# Patient Record
Sex: Female | Born: 2000 | Race: Black or African American | Hispanic: No | Marital: Single
Health system: Southern US, Community
[De-identification: ages and names within clinical notes are randomized; demographics above are authoritative.]

---

## 2019-05-01 ENCOUNTER — Other Ambulatory Visit: Payer: Self-pay

## 2019-05-01 DIAGNOSIS — Z20822 Contact with and (suspected) exposure to covid-19: Secondary | ICD-10-CM

## 2019-05-02 LAB — NOVEL CORONAVIRUS, NAA: SARS-CoV-2, NAA: NOT DETECTED

## 2020-10-05 ENCOUNTER — Emergency Department (HOSPITAL_COMMUNITY): Payer: Medicaid - Out of State

## 2020-10-05 ENCOUNTER — Emergency Department (HOSPITAL_COMMUNITY)
Admission: EM | Admit: 2020-10-05 | Discharge: 2020-10-05 | Disposition: A | Discharge status: Home / Self Care | Payer: Medicaid - Out of State | Attending: Emergency Medicine | Admitting: Emergency Medicine

## 2020-10-05 ENCOUNTER — Encounter (HOSPITAL_COMMUNITY): Payer: Self-pay | Admitting: Student in an Organized Health Care Education/Training Program

## 2020-10-05 ENCOUNTER — Other Ambulatory Visit: Payer: Self-pay

## 2020-10-05 DIAGNOSIS — S4991XA Unspecified injury of right shoulder and upper arm, initial encounter: Secondary | ICD-10-CM | POA: Diagnosis present

## 2020-10-05 DIAGNOSIS — Y9302 Activity, running: Secondary | ICD-10-CM | POA: Diagnosis not present

## 2020-10-05 DIAGNOSIS — W01198A Fall on same level from slipping, tripping and stumbling with subsequent striking against other object, initial encounter: Secondary | ICD-10-CM | POA: Insufficient documentation

## 2020-10-05 DIAGNOSIS — S43004A Unspecified dislocation of right shoulder joint, initial encounter: Secondary | ICD-10-CM | POA: Diagnosis not present

## 2020-10-05 MED ORDER — LIDOCAINE HCL (PF) 1 % IJ SOLN
10.0000 mL | Freq: Once | INTRAMUSCULAR | Status: AC
  Administered 2020-10-05: 10 mL
  Filled 2020-10-05: qty 10

## 2020-10-05 MED ORDER — DIAZEPAM 5 MG/ML IJ SOLN
5.0000 mg | Freq: Once | INTRAMUSCULAR | Status: AC
  Administered 2020-10-05: 5 mg via INTRAVENOUS
  Filled 2020-10-05: qty 2

## 2020-10-05 MED ORDER — FENTANYL CITRATE (PF) 100 MCG/2ML IJ SOLN
50.0000 ug | Freq: Once | INTRAMUSCULAR | Status: AC
  Administered 2020-10-05: 50 ug via INTRAVENOUS
  Filled 2020-10-05: qty 2

## 2020-10-05 NOTE — ED Provider Notes (Signed)
[facility] EMERGENCY DEPARTMENT Provider Note   CSN: [id number] Arrival date & time: 10/05/20  1828     History Chief Complaint  Patient presents with  . Shoulder Injury    Meagan Blair is a 20 y.o. female.   Shoulder Injury This is a new problem. The current episode started less than 1 hour ago. The problem occurs constantly. The problem has not changed since onset.Pertinent negatives include no chest pain, no abdominal pain and no shortness of breath. Nothing aggravates the symptoms. Nothing relieves the symptoms.       History reviewed. No pertinent past medical history.  There are no problems to display for this patient.   History reviewed. No pertinent surgical history.   OB History   No obstetric history on file.     History reviewed. No pertinent family history.     Home Medications Prior to Admission medications   Not on File    Allergies    Patient has no known allergies.  Review of Systems   Review of Systems  Constitutional: Negative for chills and fever.  HENT: Negative for ear pain and sore throat.   Eyes: Negative for pain and visual disturbance.  Respiratory: Negative for cough and shortness of breath.   Cardiovascular: Negative for chest pain and palpitations.  Gastrointestinal: Negative for abdominal pain and vomiting.  Genitourinary: Negative for dysuria and hematuria.  Musculoskeletal: Negative for arthralgias and back pain.       R shoulder pain  Skin: Negative for color change and rash.  Neurological: Negative for seizures and syncope.  All other systems reviewed and are negative.   Physical Exam Updated Vital Signs BP 125/77   Pulse 94   Temp 98.4 F (36.9 C) (Oral)   Resp (!) 24   Ht 5' 2.5" (1.588 m)   Wt 53.5 kg   SpO2 100%   BMI 21.24 kg/m   Physical Exam Vitals and nursing note reviewed.  Constitutional:      General: She is not in acute distress.    Appearance: She is well-developed and  well-nourished.  HENT:     Head: Normocephalic and atraumatic.  Eyes:     Conjunctiva/sclera: Conjunctivae normal.  Cardiovascular:     Rate and Rhythm: Normal rate and regular rhythm.     Heart sounds: No murmur heard.   Pulmonary:     Effort: Pulmonary effort is normal. No respiratory distress.     Breath sounds: Normal breath sounds.  Abdominal:     Palpations: Abdomen is soft.     Tenderness: There is no abdominal tenderness.  Musculoskeletal:        General: No edema.     Cervical back: Neck supple.  Skin:    General: Skin is warm and dry.  Neurological:     Mental Status: She is alert.  Psychiatric:        Mood and Affect: Mood and affect normal.     ED Results / Procedures / Treatments   Labs (all labs ordered are listed, but only abnormal results are displayed) Labs Reviewed - No data to display  EKG None  Radiology DG Shoulder Right  Result Date: 10/05/2020 CLINICAL DATA:  Trip and fall injury.  Shoulder pain EXAM: RIGHT SHOULDER - 2+ VIEW COMPARISON:  None. FINDINGS: Anterior dislocation of the right shoulder with anteromedial displacement of the humeral head respect. Suggestion of a small focal defect lateral consistent with Hill-Sachs deformity. Acromioclavicular and coracoclavicular spaces are maintained. Soft tissues  are unremarkable. IMPRESSION: Anterior dislocation of the right shoulder with probable Hill-Sachs deformity. Electronically Signed   By: Burman Nieves M.D.   On: 10/05/2020 19:05   DG Shoulder Right Portable  Result Date: 10/05/2020 CLINICAL DATA:  Reduction of shoulder dislocation EXAM: PORTABLE RIGHT SHOULDER COMPARISON:  None. FINDINGS: Improved alignment of the right glenohumeral joint following reduction. IMPRESSION: Successful reduction of right glenohumeral joint dislocation. Electronically Signed   By: Deatra Mossberg M.D.   On: 10/05/2020 21:31    Procedures .Ortho Injury Treatment  Date/Time: 10/05/2020 9:12 PM Performed by:  Kathleen Lime, MD Authorized by: Charlynne Pander, MD  Injury location: shoulder Injury type: dislocation Dislocation type: anterior Hill-Sachs deformity: yes Chronicity: new Pre-procedure neurovascular assessment: neurovascularly intact Pre-procedure distal perfusion: normal Pre-procedure neurological function: normal Pre-procedure range of motion: normal  Anesthesia: Local anesthesia used: yes Local Anesthetic: lidocaine 1% without epinephrine Anesthetic total: 10 mL  Patient sedated: NoManipulation performed: yes Reduction method: traction and counter traction Reduction successful: yes X-ray confirmed reduction: yes Immobilization: sling Post-procedure neurovascular assessment: post-procedure neurovascularly intact Post-procedure distal perfusion: normal Post-procedure neurological function: normal Post-procedure range of motion: normal      Medications Ordered in ED Medications  lidocaine (PF) (XYLOCAINE) 1 % injection 10 mL (10 mLs Other Given by Other 10/05/20 2059)  diazepam (VALIUM) injection 5 mg (5 mg Intravenous Given 10/05/20 2111)  fentaNYL (SUBLIMAZE) injection 50 mcg (50 mcg Intravenous Given 10/05/20 2111)    ED Course  I have reviewed the triage vital signs and the nursing notes.  Pertinent labs & imaging results that were available during my care of the patient were reviewed by me and considered in my medical decision making (see chart for details).    MDM Rules/Calculators/A&P                          This is a 20 year old female with no significant past medical history presents emergency department for evaluation of right shoulder injury. Patient reports that she was running for a phone, tripped over the rug and fell on her right outstretched arm when she sustained the injury. She has no history of previous shoulder injuries. She denies any numbness and tingling. Patient reports she is on birth control and denies any pregnancy.  On exam she is  afebrile, hemodynamically stable, no acute distress. The right shoulder shows an obvious deformity as she holds it slightly abducted and internally rotated. She has sensation over the axillary dermatome, and motor is intact with radial, ulnar, median nerve testing. 2+ radial pulse. Sensation is intact in the remaining C6-T1 dermatomes.  Right shoulder x-ray reveals anterior medial dislocation with likely Hill-Sachs deformity which goes along with the reported dislocation.  Right shoulder was reduced in the emergency department, neurovascularly intact following reduction.  Patient will be discharged with sling, the number for orthopedic surgery to follow-up with, and instructed use Tylenol ibuprofen for pain.   Final Clinical Impression(s) / ED Diagnoses Final diagnoses:  Dislocation of right shoulder joint, initial encounter    Rx / DC Orders ED Discharge Orders    None       Kathleen Lime, MD 10/05/20 2224    Charlynne Pander, MD 10/05/20 2237

## 2020-10-05 NOTE — ED Notes (Signed)
Reviewed discharge instructions with patient and significant other. Follow-up care and pain management reviewed. Patient and significant other verbalized understanding. Patient A&Ox4, VSS, and ambulatory with steady gait upon discharge.  

## 2020-10-05 NOTE — Discharge Instructions (Signed)
Please use the sling for comfort, you can use Tylenol and ibuprofen to help with pain.  Please return to the emergency department if you develop numbness in that arm, weakness with your wrist or grip strength.  I have provided the number for Dr. Duwayne Heck who is an orthopedic surgeon, you may call their office to schedule an appointment and let them know that you were seen in the emergency department and had a dislocated shoulder that was put back into place.

## 2020-10-05 NOTE — ED Triage Notes (Signed)
Pt from home for R shoulder deformity after falling and trying to catch herself. Arrive with shoulder in sling from EMS.

## 2022-07-17 IMAGING — DX DG SHOULDER 2+V PORT*R*
1 series · 2 of 2 positions shown · non-contrast
Comparison: None.

CLINICAL DATA: Reduction of shoulder dislocation

EXAM:
PORTABLE RIGHT SHOULDER

[Series 1: shoulder · 0.14mm/px · 2 of 2 slices shown]
[im 1/2]
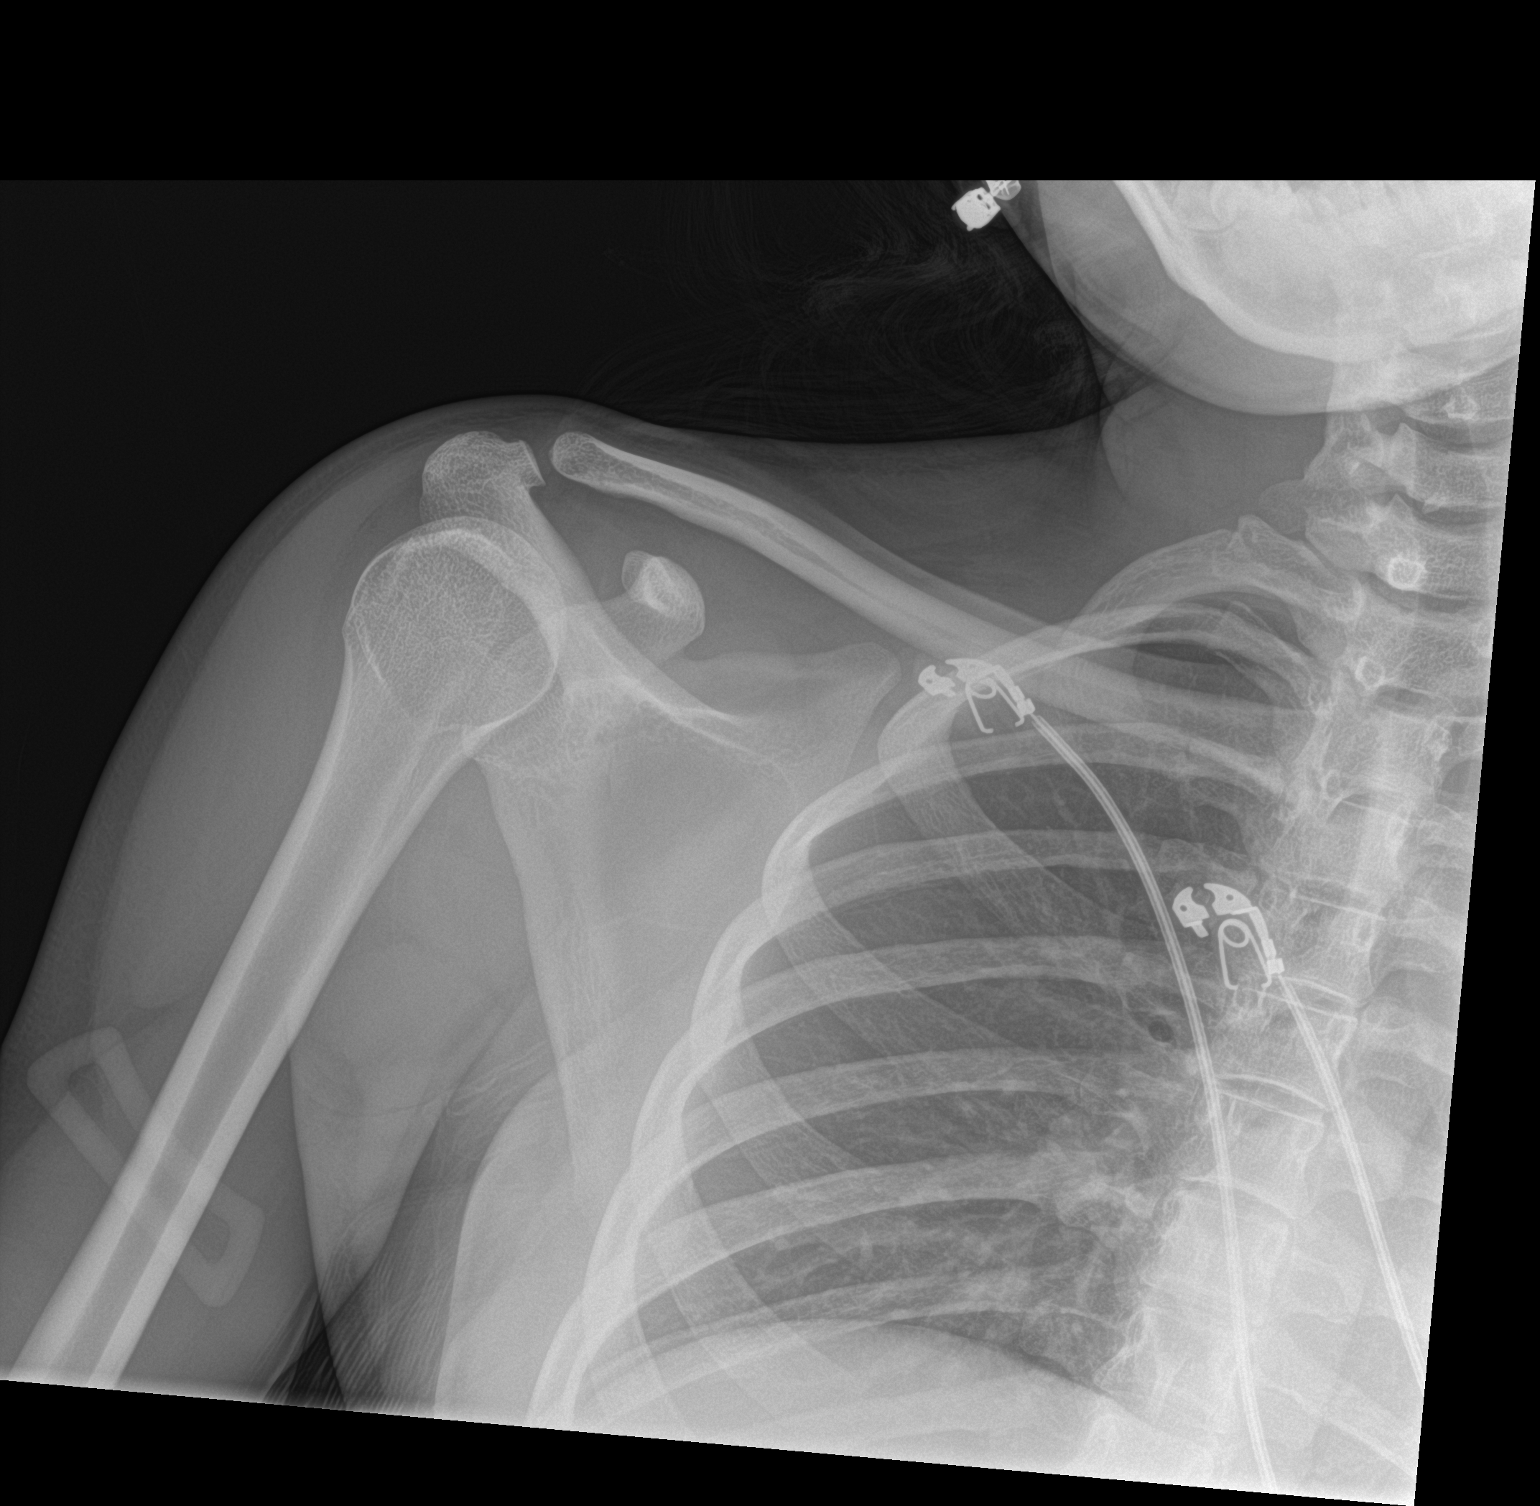
[im 2/2]
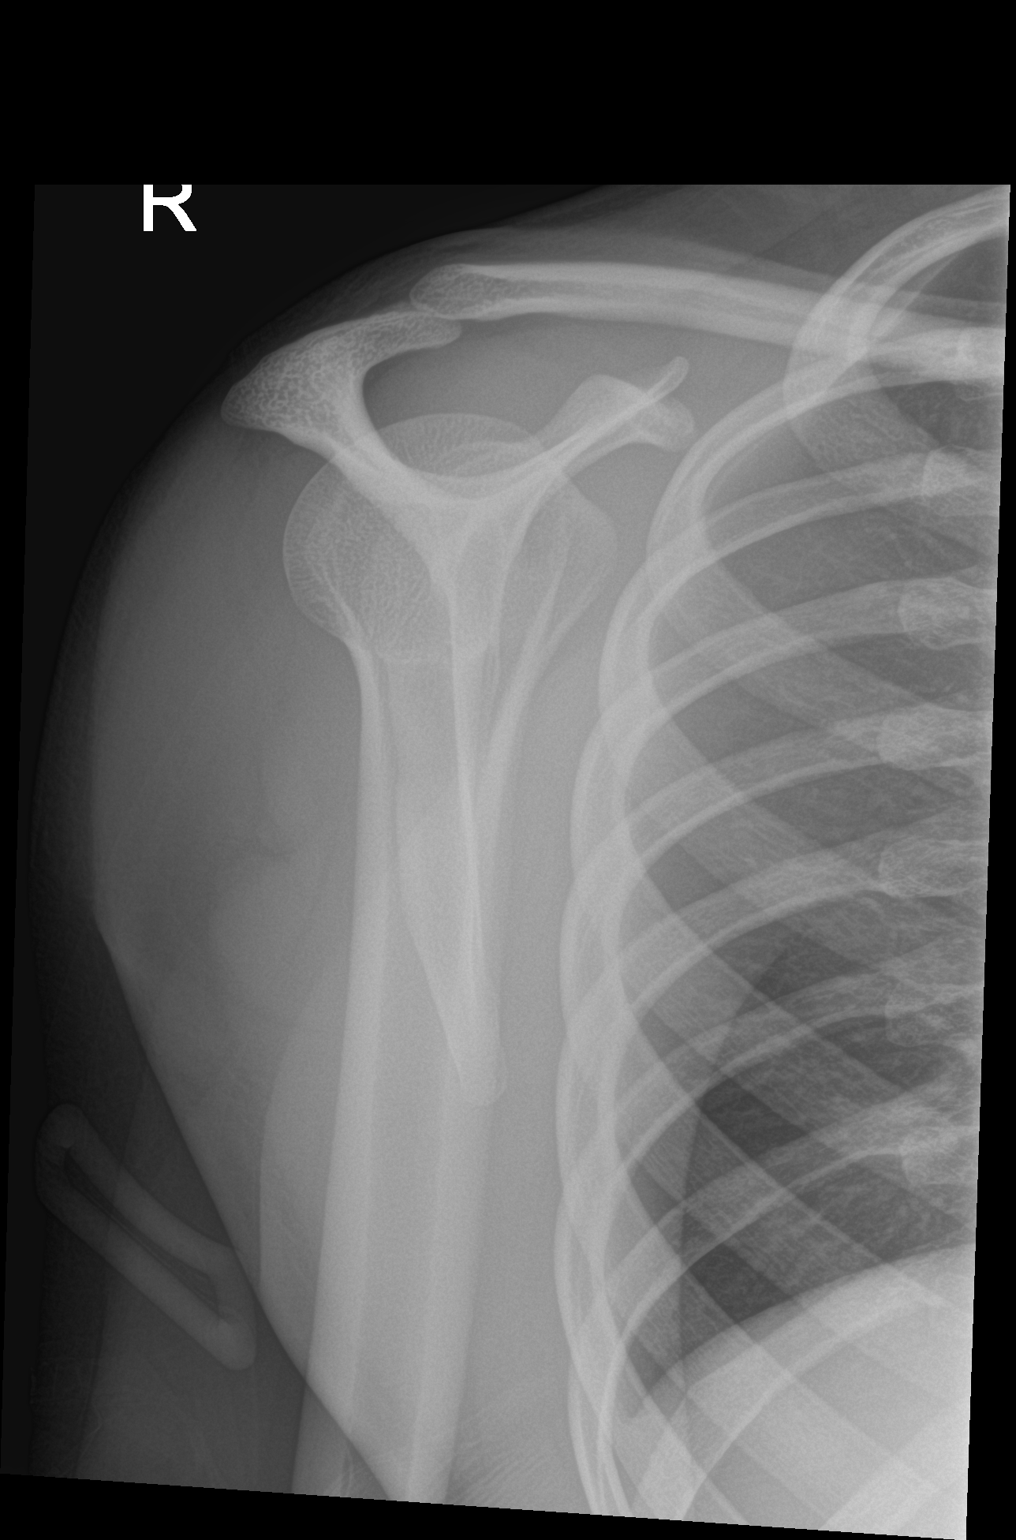

[2 of 2 positions shown; findings below may reference images not displayed]

FINDINGS: Improved alignment of the right glenohumeral joint following
reduction.
IMPRESSION: Successful reduction of right glenohumeral joint dislocation.
